# Patient Record
Sex: Male | Born: 1990 | Race: White | Hispanic: No | Marital: Married | Smoking: Current every day smoker
Health system: Southern US, Community
[De-identification: ages and names within clinical notes are randomized; demographics above are authoritative.]

## PROBLEM LIST (undated history)

## (undated) DIAGNOSIS — F419 Anxiety disorder, unspecified: Secondary | ICD-10-CM

## (undated) DIAGNOSIS — F329 Major depressive disorder, single episode, unspecified: Secondary | ICD-10-CM

## (undated) DIAGNOSIS — F32A Depression, unspecified: Secondary | ICD-10-CM

## (undated) DIAGNOSIS — F319 Bipolar disorder, unspecified: Secondary | ICD-10-CM

## (undated) DIAGNOSIS — F1910 Other psychoactive substance abuse, uncomplicated: Secondary | ICD-10-CM

## (undated) DIAGNOSIS — R569 Unspecified convulsions: Secondary | ICD-10-CM

## (undated) HISTORY — DX: Unspecified convulsions: R56.9

## (undated) HISTORY — DX: Major depressive disorder, single episode, unspecified: F32.9

## (undated) HISTORY — DX: Anxiety disorder, unspecified: F41.9

## (undated) HISTORY — DX: Bipolar disorder, unspecified: F31.9

## (undated) HISTORY — PX: FOOT SURGERY: SHX648

## (undated) HISTORY — DX: Other psychoactive substance abuse, uncomplicated: F19.10

## (undated) HISTORY — DX: Depression, unspecified: F32.A

## (undated) HISTORY — PX: MULTIPLE TOOTH EXTRACTIONS: SHX2053

---
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
## 2018-08-15 DIAGNOSIS — Z139 Encounter for screening, unspecified: Secondary | ICD-10-CM

## 2018-08-15 LAB — GLUCOSE, POCT (MANUAL RESULT ENTRY): POC Glucose: 80 mg/dl (ref 70–99)

## 2018-08-15 NOTE — Congregational Nurse Program (Signed)
  Dept: [phone]   Congregational Nurse Program Note  Date of Encounter: 08/15/2018  Past Medical History: No past medical history on file.  Encounter Details: Pt presents to clinic for screening referral for PCP per requirements of admission process at [facility].  Pt denies any current medical concerns, outside of just needing an annual physical.  Pt does states he is in need of dental care/tx.   Pt states he is currently taking meds for his bi-polar/anxiety/depression.   Currently states he is taking meds on normal schedule  Vitals checked and WNL.  Random Blood glucose was WNL (last ate.drank 1 hr prior to appt)  Vitals was reviewed with RN Nurse Case Manager, Donald Sanders and pt was referred to her for post visit f/u and medical case management.  Pt was also enrolled in [facility] program during visit by C.Broadnax  [facility]t was made for [facility] on 08/19/18 at 1:00pm

## 2018-08-19 ENCOUNTER — Other Ambulatory Visit (HOSPITAL_COMMUNITY)
Admission: RE | Admit: 2018-08-19 | Discharge: 2018-08-19 | Disposition: A | Discharge status: Home / Self Care | Payer: Self-pay | Source: Ambulatory Visit | Attending: Physician Assistant | Admitting: Physician Assistant

## 2018-08-19 ENCOUNTER — Encounter: Payer: Self-pay | Admitting: Physician Assistant

## 2018-08-19 ENCOUNTER — Ambulatory Visit: Payer: Self-pay | Admitting: Physician Assistant

## 2018-08-19 VITALS — BP 116/77 | HR 76 | Temp 98.1°F | Ht 69.5 in | Wt 167.8 lb

## 2018-08-19 DIAGNOSIS — Z8619 Personal history of other infectious and parasitic diseases: Secondary | ICD-10-CM

## 2018-08-19 DIAGNOSIS — F39 Unspecified mood [affective] disorder: Secondary | ICD-10-CM

## 2018-08-19 DIAGNOSIS — F1911 Other psychoactive substance abuse, in remission: Secondary | ICD-10-CM

## 2018-08-19 DIAGNOSIS — Z7689 Persons encountering health services in other specified circumstances: Secondary | ICD-10-CM

## 2018-08-19 DIAGNOSIS — F17200 Nicotine dependence, unspecified, uncomplicated: Secondary | ICD-10-CM

## 2018-08-19 LAB — COMPREHENSIVE METABOLIC PANEL
ALT: 430 U/L — ABNORMAL HIGH (ref 0–44)
AST: 281 U/L — AB (ref 15–41)
Albumin: 4.8 g/dL (ref 3.5–5.0)
Alkaline Phosphatase: 119 U/L (ref 38–126)
Anion gap: 9 (ref 5–15)
BUN: 10 mg/dL (ref 6–20)
CO2: 25 mmol/L (ref 22–32)
Calcium: 9.9 mg/dL (ref 8.9–10.3)
Chloride: 102 mmol/L (ref 98–111)
Creatinine, Ser: 0.81 mg/dL (ref 0.61–1.24)
GFR calc Af Amer: >60 mL/min (ref >60)
GFR calc non Af Amer: >60 mL/min (ref >60)
Glucose, Bld: 85 mg/dL (ref 70–99)
POTASSIUM: 4 mmol/L (ref 3.5–5.1)
Sodium: 136 mmol/L (ref 135–145)
Total Bilirubin: 1.1 mg/dL (ref 0.3–1.2)
Total Protein: 8.5 g/dL — ABNORMAL HIGH (ref 6.5–8.1)

## 2018-08-19 NOTE — Progress Notes (Signed)
BP 116/77 (BP Location: Right Arm, Patient Position: Sitting, Cuff Size: Normal)   Pulse 76   Temp 98.1 F (36.7 C)   Ht 5' 9.5" (1.765 m)   Wt 167 lb 12 oz (76.1 kg)   SpO2 98%   BMI 24.42 kg/m    Subjective:    Patient ID: Donald Sanders, male    DOB: 1991-02-15, 28 y.o.   MRN: [id number]  HPI: Donald Sanders is a 28 y.o. male presenting on 08/19/2018 for New Patient (Initial Visit) (previous patient at [facility] in [place]. pt was last seen there 2019)   HPI   Pt moved into [facility] house about a week ago.  He has been to [facility] for [facility] issues.    Pt had some seizures in the past- last one about a year ago- was treated at [facility]- felt to be related to drug use.   Pt diagnosed with Hep C in 2015.  He says he has never been treated due to continuing drug abuse.   Relevant past medical, surgical, family and social history reviewed and updated as indicated. Interim medical history since our last visit reviewed. Allergies and medications reviewed and updated.   Current Outpatient Medications:  .  lithium carbonate 300 MG capsule, Take 300 mg by mouth 2 (two) times daily with a meal., Disp: , Rfl:  .  Mirtazapine (REMERON PO), Take 1 tablet by mouth at bedtime., Disp: , Rfl:  .  QUEtiapine (SEROQUEL) 100 MG tablet, Take 100 mg by mouth at bedtime., Disp: , Rfl:  .  traZODone (DESYREL) 50 MG tablet, Take 50 mg by mouth at bedtime., Disp: , Rfl:   Review of Systems  Constitutional: Negative for appetite change, chills, diaphoresis, fatigue, fever and unexpected weight change.  HENT: Positive for congestion. Negative for dental problem, drooling, ear pain, facial swelling, hearing loss, mouth sores, sneezing, sore throat, trouble swallowing and voice change.   Eyes: Negative for pain, discharge, redness, itching and visual disturbance.  Respiratory: Negative for cough, choking, shortness of breath and wheezing.   Cardiovascular: Negative for chest pain, palpitations  and leg swelling.  Gastrointestinal: Negative for abdominal pain, blood in stool, constipation, diarrhea and vomiting.  Endocrine: Negative for cold intolerance, heat intolerance and polydipsia.  Genitourinary: Positive for decreased urine volume. Negative for dysuria and hematuria.  Musculoskeletal: Positive for arthralgias and back pain. Negative for gait problem.  Skin: Negative for rash.  Allergic/Immunologic: Negative for environmental allergies.  Neurological: Negative for seizures, syncope, light-headedness and headaches.  Hematological: Negative for adenopathy.  Psychiatric/Behavioral: Negative for agitation, dysphoric mood and suicidal ideas. The patient is not nervous/anxious.     Per HPI unless specifically indicated above     Objective:    BP 116/77 (BP Location: Right Arm, Patient Position: Sitting, Cuff Size: Normal)   Pulse 76   Temp 98.1 F (36.7 C)   Ht 5' 9.5" (1.765 m)   Wt 167 lb 12 oz (76.1 kg)   SpO2 98%   BMI 24.42 kg/m   Wt Readings from Last 3 Encounters:  08/19/18 167 lb 12 oz (76.1 kg)  08/15/18 167 lb (75.8 kg)    Physical Exam Vitals signs reviewed.  Constitutional:      Appearance: He is well-developed.  HENT:     Head: Normocephalic and atraumatic.     Mouth/Throat:     Pharynx: No oropharyngeal exudate.  Eyes:     Conjunctiva/sclera: Conjunctivae normal.     Pupils: Pupils are equal, round,  and reactive to light.  Neck:     Musculoskeletal: Neck supple.     Thyroid: No thyromegaly.  Cardiovascular:     Rate and Rhythm: Normal rate and regular rhythm.  Pulmonary:     Effort: Pulmonary effort is normal.     Breath sounds: Normal breath sounds. No wheezing or rales.  Abdominal:     General: Bowel sounds are normal.     Palpations: Abdomen is soft. There is no mass.     Tenderness: There is no abdominal tenderness.  Lymphadenopathy:     Cervical: No cervical adenopathy.  Skin:    General: Skin is warm and dry.     Findings: No rash.   Neurological:     Mental Status: He is alert and oriented to person, place, and time.  Psychiatric:        Behavior: Behavior normal.        Thought Content: Thought content normal.         Assessment & Plan:   Encounter Diagnoses  Name Primary?  . Encounter to establish care Yes  . History of hepatitis C   . Substance abuse in remission (HCC)   . Tobacco use disorder   . Mood disorder (HCC)     -pt at [facility] house for addiction -pt to continue with [facility] for mental health issues -will check hepatitis labs.  If positive, will refer to GI for evaluation and treatment -pt to follow up 1 month.  RTO sooner prn

## 2018-08-20 LAB — HEPATITIS C ANTIBODY: HCV Ab: >11 s/co ratio — ABNORMAL HIGH (ref 0.0–0.9)

## 2018-09-17 ENCOUNTER — Ambulatory Visit: Payer: Self-pay | Admitting: Physician Assistant

## 2018-09-17 DIAGNOSIS — R945 Abnormal results of liver function studies: Secondary | ICD-10-CM

## 2018-09-17 DIAGNOSIS — F17200 Nicotine dependence, unspecified, uncomplicated: Secondary | ICD-10-CM

## 2018-09-17 DIAGNOSIS — F1911 Other psychoactive substance abuse, in remission: Secondary | ICD-10-CM

## 2018-09-17 DIAGNOSIS — R7989 Other specified abnormal findings of blood chemistry: Secondary | ICD-10-CM

## 2018-09-17 DIAGNOSIS — B1920 Unspecified viral hepatitis C without hepatic coma: Secondary | ICD-10-CM | POA: Insufficient documentation

## 2018-09-17 NOTE — Progress Notes (Signed)
There were no vitals taken for this visit.   Subjective:    Patient ID: Donald Sanders, male    DOB: 04/06/1991, 28 y.o.   MRN: [id number]  HPI: Donald Sanders is a 28 y.o. male presenting on 09/17/2018 for No chief complaint on file.   HPI   This is a telemedicine appointment through [employer] due to the coronavirus pandemic  Pt is still living at [facility] house getting treatment for addiction.  He says he is doing well and has no fevers, SOB or other complaints.   He is still smoking.   He is still going to [facility] for MH issues  Relevant past medical, surgical, family and social history reviewed and updated as indicated. Interim medical history since our last visit reviewed. Allergies and medications reviewed and updated.   Current Outpatient Medications:  .  lithium carbonate 300 MG capsule, Take 300 mg by mouth 2 (two) times daily with a meal., Disp: , Rfl:  .  Mirtazapine (REMERON PO), Take 1 tablet by mouth at bedtime., Disp: , Rfl:  .  QUEtiapine (SEROQUEL) 100 MG tablet, Take 100 mg by mouth at bedtime., Disp: , Rfl:  .  traZODone (DESYREL) 50 MG tablet, Take 50 mg by mouth at bedtime., Disp: , Rfl:     Review of Systems  Per HPI unless specifically indicated above     Objective:    There were no vitals taken for this visit.  Wt Readings from Last 3 Encounters:  08/19/18 167 lb 12 oz (76.1 kg)  08/15/18 167 lb (75.8 kg)    Physical Exam Constitutional:      Appearance: Normal appearance.  HENT:     Head: Normocephalic and atraumatic.  Pulmonary:     Effort: Pulmonary effort is normal. No respiratory distress.  Neurological:     Mental Status: He is alert and oriented to person, place, and time.  Psychiatric:        Mood and Affect: Mood normal.        Behavior: Behavior normal.     Results for orders placed or performed during the hospital encounter of 08/19/18  Hepatitis C Antibody  Result Value Ref Range   HCV Ab >11.0 (H) 0.0 - 0.9 s/co ratio   Comprehensive metabolic panel  Result Value Ref Range   Sodium 136 135 - 145 mmol/L   Potassium 4.0 3.5 - 5.1 mmol/L   Chloride 102 98 - 111 mmol/L   CO2 25 22 - 32 mmol/L   Glucose, Bld 85 70 - 99 mg/dL   BUN 10 6 - 20 mg/dL   Creatinine, Ser 3.81 0.61 - 1.24 mg/dL   Calcium 9.9 8.9 - 77.1 mg/dL   Total Protein 8.5 (H) 6.5 - 8.1 g/dL   Albumin 4.8 3.5 - 5.0 g/dL   AST 165 (H) 15 - 41 U/L   ALT 430 (H) 0 - 44 U/L   Alkaline Phosphatase 119 38 - 126 U/L   Total Bilirubin 1.1 0.3 - 1.2 mg/dL   GFR calc non Af Amer >60 >60 mL/min   GFR calc Af Amer >60 >60 mL/min   Anion gap 9 5 - 15      Assessment & Plan:   Encounter Diagnoses  Name Primary?  . Hepatitis C virus infection without hepatic coma, unspecified chronicity Yes  . Substance abuse in remission (HCC)   . Tobacco use disorder   . Elevated LFTs      -reviewed labs with pt -will Refer to GI  for hep C -mailed pt an application for [facility] charity care -Pt was counseled to avoid APAP due to elevated LFTs -Pt encouraged to wash hands, try to keep social distancing, etc.  Also encouraged pt to stop smoking -pt to Follow up in February.  RTO sooner prn

## 2018-09-18 ENCOUNTER — Encounter: Payer: Self-pay | Admitting: Physician Assistant

## 2018-09-23 ENCOUNTER — Encounter: Payer: Self-pay | Admitting: Gastroenterology

## 2018-12-31 ENCOUNTER — Ambulatory Visit (INDEPENDENT_AMBULATORY_CARE_PROVIDER_SITE_OTHER): Payer: Self-pay | Admitting: Gastroenterology

## 2018-12-31 ENCOUNTER — Other Ambulatory Visit: Payer: Self-pay

## 2018-12-31 ENCOUNTER — Encounter: Payer: Self-pay | Admitting: Gastroenterology

## 2018-12-31 VITALS — BP 116/71 | HR 78 | Temp 98.5°F | Ht 69.0 in | Wt 176.2 lb

## 2018-12-31 DIAGNOSIS — R7989 Other specified abnormal findings of blood chemistry: Secondary | ICD-10-CM

## 2018-12-31 DIAGNOSIS — B182 Chronic viral hepatitis C: Secondary | ICD-10-CM

## 2018-12-31 DIAGNOSIS — R945 Abnormal results of liver function studies: Secondary | ICD-10-CM

## 2018-12-31 NOTE — Assessment & Plan Note (Signed)
28 year old gentleman with history of polysubstance abuse, currently in remission, presenting for positive hepatitis C antibody, transaminitis.  Patient initially told he had hepatitis C after plasma donation in 2015.  He reports that subsequent testing was negative for hepatitis C but it is not clear if he had a negative vomiting.  Recent hepatitis C antibody positive.  Transaminases more elevated than what would be expected with chronic hep C only.  Less likely, but cannot exclude, acute reinfection of HCV.  At this point in time, obtain HCVRNA with genotype.  Update LFTs.  Screen for hemochromatosis, HIV, hepatitis B.  Evaluate for fibrosis.  Discussed hepatitis C treatment at length with patient.  Discussed natural history, modes of transmission for hepatitis C as well.  Precautions provided.  If labs are favorable, we will move towards hepatitis C treatment in the near future.  Patient is aware that compliance is crucial for a eradication of hepatitis C.  He is aware that he can be reinfected even after initial cure. Further recommendations in near future once labs and u/s results available.

## 2018-12-31 NOTE — Patient Instructions (Signed)
1. Please have your labs and ultrasound done. 2. Please do not share razors, nail clippers, toothbrushes, needles. Use barrier protection (condoms) during sexual activity.    Hepatitis C  Hepatitis C is a viral infection of the liver. It can lead to scarring of the liver (cirrhosis), liver failure, or liver cancer. Hepatitis C may go undetected for months or years because people with the infection may not have symptoms, or they may have only mild symptoms. What are the causes? This condition is caused by the hepatitis C virus (HCV). The virus can spread from person to person (is contagious) through:  Blood.  Childbirth. A woman who has hepatitis C can pass it to her baby during birth.  Bodily fluids, such as breast milk, tears, semen, vaginal fluids, and saliva.  Blood transfusions or organ transplants done in the [place] before 1992. What increases the risk? The following factors may make you more likely to develop this condition:  Having contact with unclean (contaminated) needles or syringes. This may result from: ? Acupuncture. ? Tattoing. ? Body piercing. ? Injecting drugs.  Having unprotected sex with someone who is infected.  Needing treatment to filter your blood (kidney dialysis).  Having HIV (human immunodeficiency virus) or AIDS (acquired immunodeficiency syndrome).  Working in a job that involves contact with blood or bodily fluids, such as health care. What are the signs or symptoms? Symptoms of this condition include:  Fatigue.  Loss of appetite.  Nausea.  Vomiting.  Abdominal pain.  Dark yellow urine.  Yellowish skin and eyes (jaundice).  Itchy skin.  Clay-colored bowel movements.  Joint pain.  Bleeding and bruising easily.  Fluid building up in your stomach (ascites). In some cases, you may not have any symptoms. How is this diagnosed? This condition is diagnosed with:  Blood tests.  Other tests to check how well your liver is  functioning. They may include: ? Magnetic resonance elastography (MRE). This imaging test uses MRIs and sound waves to measure liver stiffness. ? Transient elastography. This imaging test uses ultrasounds to measure liver stiffness. ? Liver biopsy. This test requires taking a small tissue sample from your liver to examine it under a microscope. How is this treated? Your health care provider may perform noninvasive tests or a liver biopsy to help decide the best course of treatment. Treatment may include:  Antiviral medicines and other medicines.  Follow-up treatments every 6-12 months for infections or other liver conditions.  Receiving a donated liver (liver transplant). Follow these instructions at home: Medicines  Take over-the-counter and prescription medicines only as told by your health care provider.  Take your antiviral medicine as told by your health care provider. Do not stop taking the antiviral even if you start to feel better.  Do not take any medicines unless approved by your health care provider, including over-the-counter medicines and birth control pills. Activity  Rest as needed.  Do not have sex unless approved by your health care provider.  Ask your health care provider when you may return to school or work. Eating and drinking   Eat a balanced diet with plenty of fruits and vegetables, whole grains, and lowfat (lean) meats or non-meat proteins (such as beans or tofu).  Drink enough fluids to keep your urine clear or pale yellow.  Do not drink alcohol. General instructions  Do not share toothbrushes, nail clippers, or razors.  Wash your hands frequently with soap and water. If soap and water are not available, use hand sanitizer.  Cover any cuts or open sores on your skin to prevent spreading the virus.  Keep all follow-up visits as told by your health care provider. This is important. You may need follow-up visits every 6-12 months. How is this  prevented? There is no vaccine for hepatitis C. The only way to prevent the disease is to reduce the risk of exposure to the virus. Make sure you:  Wash your hands frequently with soap and water. If soap and water are not available, use hand sanitizer.  Do not share needles or syringes.  Practice safe sex and use condoms.  Avoid handling blood or bodily fluids without gloves or other protection.  Avoid getting tattoos or piercings in shops or other locations that are not clean. Contact a health care provider if:  You have a fever.  You develop abdominal pain.  You pass dark urine.  You pass clay-colored stools.  You develop joint pain. Get help right away if:  You have increasing fatigue or weakness.  You lose your appetite.  You cannot eat or drink without vomiting.  You develop jaundice or your jaundice gets worse.  You bruise or bleed easily. Summary  Hepatitis C is a viral infection of the liver. It can lead to scarring of the liver (cirrhosis), liver failure, or liver cancer.  The hepatitis C virus (HCV) causes this condition. The virus can pass from person to person (is contagious).  You should not take any medicines unless approved by your health care provider. This includes over-the-counter medicines and birth control pills. This information is not intended to replace advice given to you by your health care provider. Make sure you discuss any questions you have with your health care provider. Document Released: 06/09/2000 Document Revised: 05/25/2017 Document Reviewed: 07/18/2016 Elsevier Patient Education  2020 [employer].

## 2018-12-31 NOTE — Progress Notes (Signed)
Primary Care Physician:  Soyla Dryer, PA-C  Primary Gastroenterologist:  Barney Drain, MD   Chief Complaint  Patient presents with  . Hepatitis C    has not had treatment. Has not had any drug use since 06/2018 per pt    HPI:  Donald Sanders is a 28 y.o. male here at the request of Soyla Dryer, PA-C for further evaluation of chronic hepatitis C.  She tells me that he initially found out he had hepatitis C after donating plasma in 2015.  States that his PCP ran additional test and told him that he was "borderline" because the second test was negative.  It is not clear if the second test was an RNA level.  Over the past 1 year, he has had labs done routinely through the "detox" clinic and was noted to have elevated LFTs which were presumably related to his chronic hepatitis C.  Patient has been clean for 6 months.  He quit alcohol and IV drug use back in January.  Currently living at [facility].  He plans to stay locally when he is released in the next couple months.  Labs back in February showed a positive hepatitis C antibody, LFT showed normal total bilirubin, alk phos.  His albumin was normal.  His AST was 281, ALT 130.  Clinically he feels well.  Denies any history of jaundice.  No abdominal pain.  Appetite is good.  Works in Biomedical scientist.  No constipation, diarrhea, melena, rectal bleeding, itching, weight loss.  No heartburn.  Currently sexually active, partner has hepatitis C.  Reports using barrier protection.  No family history of liver disease.  Previous alcohol abuse, no history of withdrawals.  Last known well 6 months ago.   Current Outpatient Medications  Medication Sig Dispense Refill  . ibuprofen (ADVIL) 200 MG tablet Take 200 mg by mouth every 6 (six) hours as needed.    . Mirtazapine (REMERON PO) Take 1 tablet by mouth at bedtime.     No current facility-administered medications for this visit.     Allergies as of 12/31/2018 - Review Complete 12/31/2018  Allergen  Reaction Noted  . Tuberculin tests Itching 08/19/2018    Past Medical History:  Diagnosis Date  . Anxiety   . Bipolar 1 disorder ([place])   . Depression   . Seizures ([place])    last seizure around 2019  . Substance abuse [facility])     Past Surgical History:  Procedure Laterality Date  . FOOT SURGERY Right    "screw in foot"  . MULTIPLE TOOTH EXTRACTIONS    . NOSE SURGERY  2006    Family History  Problem Relation Age of Onset  . Hypertension Maternal Grandmother   . Cancer Maternal Grandfather        lung, skin and brain cancer  . Liver disease Neg Hx   . Colon cancer Neg Hx     Social History   Socioeconomic History  . Marital status: Married    Spouse name: Not on file  . Number of children: Not on file  . Years of education: Not on file  . Highest education level: Not on file  Occupational History  . Not on file  Social Needs  . Financial resource strain: Not on file  . Food insecurity    Worry: Not on file    Inability: Not on file  . Transportation needs    Medical: Not on file    Non-medical: Not on file  Tobacco Use  . Smoking  status: Current Every Day Smoker    Packs/day: 0.50    Years: 15.00    Pack years: 7.50    Types: Cigarettes, E-cigarettes  . Smokeless tobacco: Never Used  Substance and Sexual Activity  . Alcohol use: Not Currently    Comment: none since 06/2018. 12 pack per week  . Drug use: Not Currently    Types: Heroin, Methamphetamines, Marijuana, Cocaine    Comment: none since 07/25/18.  main drug heroin, meth  . Sexual activity: Not on file  Lifestyle  . Physical activity    Days per week: Not on file    Minutes per session: Not on file  . Stress: Not on file  Relationships  . Social Herbalist on phone: Not on file    Gets together: Not on file    Attends religious service: Not on file    Active member of club or organization: Not on file    Attends meetings of clubs or organizations: Not on file    Relationship status:  Not on file  . Intimate partner violence    Fear of current or ex partner: Not on file    Emotionally abused: Not on file    Physically abused: Not on file    Forced sexual activity: Not on file  Other Topics Concern  . Not on file  Social History Narrative  . Not on file      ROS:  General: Negative for anorexia, weight loss, fever, chills, fatigue, weakness. Eyes: Negative for vision changes.  ENT: Negative for hoarseness, difficulty swallowing , nasal congestion. CV: Negative for chest pain, angina, palpitations, dyspnea on exertion, peripheral edema.  Respiratory: Negative for dyspnea at rest, dyspnea on exertion, cough, sputum, wheezing.  GI: See history of present illness. GU:  Negative for dysuria, hematuria, urinary incontinence, urinary frequency, nocturnal urination.  MS: Negative for joint pain, low back pain.  Derm: Negative for rash or itching.  Neuro: Negative for weakness, abnormal sensation, seizure, frequent headaches, memory loss, confusion.  Psych: Negative for anxiety, depression, suicidal ideation, hallucinations.  Endo: Negative for unusual weight change.  Heme: Negative for bruising or bleeding. Allergy: Negative for rash or hives.    Physical Examination:  BP 116/71   Pulse 78   Temp 98.5 F (36.9 C) (Oral)   Ht _0  (1.753 m)   Wt 176 lb 3.2 oz (79.9 kg)   BMI 26.02 kg/m    General: Well-nourished, well-developed in no acute distress.  Head: Normocephalic, atraumatic.   Eyes: Conjunctiva pink, no icterus. Mouth: Oropharyngeal mucosa moist and pink , no lesions erythema or exudate. Neck: Supple without thyromegaly, masses, or lymphadenopathy.  Lungs: Clear to auscultation bilaterally.  Heart: Regular rate and rhythm, no murmurs rubs or gallops.  Abdomen: Bowel sounds are normal, nontender, nondistended, no hepatosplenomegaly or masses, no abdominal bruits or    hernia , no rebound or guarding.   Rectal: not performed Extremities: No lower  extremity edema. No clubbing or deformities.  Neuro: Alert and oriented x 4 , grossly normal neurologically.  Skin: Warm and dry, no rash or jaundice.   Psych: Alert and cooperative, normal mood and affect.  Labs: Lab Results  Component Value Date   CREATININE 0.81 08/19/2018   BUN 10 08/19/2018   NA 136 08/19/2018   K 4.0 08/19/2018   CL 102 08/19/2018   CO2 25 08/19/2018   Lab Results  Component Value Date   ALT 430 (H) 08/19/2018   AST  281 (H) 08/19/2018   ALKPHOS 119 08/19/2018   BILITOT 1.1 08/19/2018   No results found for: HAV, HEPAIGM, HEPBIGM, HEPBCAB, HBEAG, HEPCAB No results found for: HAV, HEPAIGM, HEPBIGM, HEPBCAB, HBEAG, HEPCAB c   Imaging Studies: No results found.

## 2019-01-01 NOTE — Progress Notes (Signed)
cc'ed to pcp °

## 2019-01-03 ENCOUNTER — Other Ambulatory Visit (HOSPITAL_COMMUNITY)
Admission: RE | Admit: 2019-01-03 | Discharge: 2019-01-03 | Disposition: A | Discharge status: Home / Self Care | Payer: Self-pay | Source: Ambulatory Visit | Attending: Physician Assistant | Admitting: Physician Assistant

## 2019-01-03 ENCOUNTER — Ambulatory Visit (HOSPITAL_COMMUNITY)
Admission: RE | Admit: 2019-01-03 | Discharge: 2019-01-03 | Disposition: A | Discharge status: Home / Self Care | Payer: Self-pay | Source: Ambulatory Visit | Attending: Gastroenterology | Admitting: Gastroenterology

## 2019-01-03 ENCOUNTER — Other Ambulatory Visit: Payer: Self-pay

## 2019-01-03 DIAGNOSIS — R945 Abnormal results of liver function studies: Secondary | ICD-10-CM | POA: Insufficient documentation

## 2019-01-03 DIAGNOSIS — B182 Chronic viral hepatitis C: Secondary | ICD-10-CM | POA: Insufficient documentation

## 2019-01-03 DIAGNOSIS — R69 Illness, unspecified: Secondary | ICD-10-CM | POA: Insufficient documentation

## 2019-01-03 DIAGNOSIS — R7989 Other specified abnormal findings of blood chemistry: Secondary | ICD-10-CM

## 2019-01-03 LAB — CBC WITH DIFFERENTIAL/PLATELET
Abs Immature Granulocytes: 0.01 10*3/uL (ref 0.00–0.07)
Basophils Absolute: 0.1 10*3/uL (ref 0.0–0.1)
Basophils Relative: 1 %
Eosinophils Absolute: 0.5 10*3/uL (ref 0.0–0.5)
Eosinophils Relative: 9 %
HCT: 42.2 % (ref 39.0–52.0)
Hemoglobin: 14.1 g/dL (ref 13.0–17.0)
Immature Granulocytes: 0 %
Lymphocytes Relative: 38 %
Lymphs Abs: 1.9 10*3/uL (ref 0.7–4.0)
MCH: 30.3 pg (ref 26.0–34.0)
MCHC: 33.4 g/dL (ref 30.0–36.0)
MCV: 90.8 fL (ref 80.0–100.0)
Monocytes Absolute: 0.6 10*3/uL (ref 0.1–1.0)
Monocytes Relative: 12 %
Neutro Abs: 1.9 10*3/uL (ref 1.7–7.7)
Neutrophils Relative %: 40 %
Platelets: 230 10*3/uL (ref 150–400)
RBC: 4.65 MIL/uL (ref 4.22–5.81)
RDW: 13.4 % (ref 11.5–15.5)
WBC: 5 10*3/uL (ref 4.0–10.5)
nRBC: 0 % (ref 0.0–0.2)

## 2019-01-03 LAB — COMPREHENSIVE METABOLIC PANEL
ALT: 102 U/L — ABNORMAL HIGH (ref 0–44)
AST: 93 U/L — ABNORMAL HIGH (ref 15–41)
Albumin: 4.7 g/dL (ref 3.5–5.0)
Alkaline Phosphatase: 80 U/L (ref 38–126)
Anion gap: 8 (ref 5–15)
BUN: 15 mg/dL (ref 6–20)
CO2: 23 mmol/L (ref 22–32)
Calcium: 9.4 mg/dL (ref 8.9–10.3)
Chloride: 109 mmol/L (ref 98–111)
Creatinine, Ser: 0.83 mg/dL (ref 0.61–1.24)
GFR calc Af Amer: >60 mL/min (ref >60)
GFR calc non Af Amer: >60 mL/min (ref >60)
Glucose, Bld: 88 mg/dL (ref 70–99)
Potassium: 3.8 mmol/L (ref 3.5–5.1)
Sodium: 140 mmol/L (ref 135–145)
Total Bilirubin: 1 mg/dL (ref 0.3–1.2)
Total Protein: 7.8 g/dL (ref 6.5–8.1)

## 2019-01-03 LAB — FERRITIN: Ferritin: 84 ng/mL (ref 24–336)

## 2019-01-03 LAB — IRON AND TIBC
Iron: 143 ug/dL (ref 45–182)
Saturation Ratios: 36 % (ref 17.9–39.5)
TIBC: 397 ug/dL (ref 250–450)
UIBC: 254 ug/dL

## 2019-01-04 LAB — HIV ANTIBODY (ROUTINE TESTING W REFLEX): HIV Screen 4th Generation wRfx: NONREACTIVE

## 2019-01-04 LAB — HEPATITIS B CORE ANTIBODY, TOTAL: Hep B Core Total Ab: NEGATIVE

## 2019-01-04 LAB — HEPATITIS B SURFACE ANTIGEN: Hepatitis B Surface Ag: NEGATIVE

## 2019-01-04 LAB — HEPATITIS B SURFACE ANTIBODY, QUANTITATIVE: Hep B S AB Quant (Post): <3.1 m[IU]/mL — ABNORMAL LOW (ref >9.9)

## 2019-01-06 ENCOUNTER — Other Ambulatory Visit: Payer: Self-pay | Admitting: *Deleted

## 2019-01-06 DIAGNOSIS — Z20822 Contact with and (suspected) exposure to covid-19: Secondary | ICD-10-CM

## 2019-01-06 NOTE — Progress Notes (Signed)
Donald Sanders

## 2019-01-07 LAB — HCV FIBROSURE
ALPHA 2-MACROGLOBULINS, QN: 359 mg/dL — ABNORMAL HIGH (ref 110–276)
ALT (SGPT) P5P: 109 IU/L — ABNORMAL HIGH (ref 0–55)
Apolipoprotein A-1: 109 mg/dL (ref 101–178)
Bilirubin, Total: 0.7 mg/dL (ref 0.0–1.2)
Fibrosis Score: 0.57 — ABNORMAL HIGH (ref 0.00–0.21)
GGT: 53 IU/L (ref 0–65)
Haptoglobin: 114 mg/dL (ref 17–317)
Necroinflammat Activity Score: 0.7 — ABNORMAL HIGH (ref 0.00–0.17)

## 2019-01-07 NOTE — Progress Notes (Signed)
Pt is aware.  

## 2019-01-09 LAB — HCV RNA QUANT RFLX ULTRA OR GENOTYP
HCV RNA Qnt(log copy/mL): 5.407 log10 IU/mL
HepC Qn: 255000 IU/mL

## 2019-01-09 LAB — HEPATITIS C GENOTYPE

## 2019-01-10 LAB — NOVEL CORONAVIRUS, NAA: SARS-CoV-2, NAA: NOT DETECTED

## 2019-02-17 ENCOUNTER — Telehealth: Payer: Self-pay | Admitting: Gastroenterology

## 2019-02-17 NOTE — Telephone Encounter (Signed)
[phone]  Patient received letter, please call.  He is wanting to know when he can start his hep c treatment

## 2019-02-19 NOTE — Telephone Encounter (Signed)
I have called [facility] and spoke to Lemar Livings and she is faxing the form for pt and provider to complete and fax back.

## 2019-02-19 NOTE — Telephone Encounter (Signed)
Tried to call pt and could not leave a message.  

## 2019-02-19 NOTE — Telephone Encounter (Signed)
Pt is aware the form is here and ready to be completed. He requested me mail it to his address and he will complete and get back to me. He is out of town at this time, but said to go on and mail it. I am putting in mail now.

## 2019-05-13 ENCOUNTER — Other Ambulatory Visit: Payer: Self-pay

## 2019-05-13 ENCOUNTER — Emergency Department (HOSPITAL_COMMUNITY)
Admission: EM | Admit: 2019-05-13 | Discharge: 2019-05-13 | Disposition: A | Discharge status: Home / Self Care | Payer: Self-pay

## 2019-05-13 NOTE — ED Triage Notes (Signed)
Called for triage x1.

## 2019-06-17 ENCOUNTER — Other Ambulatory Visit: Payer: Self-pay

## 2019-07-09 ENCOUNTER — Emergency Department (HOSPITAL_COMMUNITY)
Admission: EM | Admit: 2019-07-09 | Discharge: 2019-07-09 | Disposition: A | Discharge status: Home / Self Care | Payer: Self-pay | Attending: Emergency Medicine | Admitting: Emergency Medicine

## 2019-07-09 ENCOUNTER — Emergency Department (HOSPITAL_COMMUNITY): Payer: Self-pay

## 2019-07-09 ENCOUNTER — Other Ambulatory Visit: Payer: Self-pay

## 2019-07-09 DIAGNOSIS — R0789 Other chest pain: Secondary | ICD-10-CM | POA: Insufficient documentation

## 2019-07-09 DIAGNOSIS — F17210 Nicotine dependence, cigarettes, uncomplicated: Secondary | ICD-10-CM | POA: Insufficient documentation

## 2019-07-09 DIAGNOSIS — R079 Chest pain, unspecified: Secondary | ICD-10-CM

## 2019-07-09 DIAGNOSIS — Z79899 Other long term (current) drug therapy: Secondary | ICD-10-CM | POA: Insufficient documentation

## 2019-07-09 LAB — BASIC METABOLIC PANEL
Anion gap: 10 (ref 5–15)
BUN: 9 mg/dL (ref 6–20)
CO2: 22 mmol/L (ref 22–32)
Calcium: 9.2 mg/dL (ref 8.9–10.3)
Chloride: 102 mmol/L (ref 98–111)
Creatinine, Ser: 0.78 mg/dL (ref 0.61–1.24)
GFR calc Af Amer: >60 mL/min (ref >60)
GFR calc non Af Amer: >60 mL/min (ref >60)
Glucose, Bld: 123 mg/dL — ABNORMAL HIGH (ref 70–99)
Potassium: 3.9 mmol/L (ref 3.5–5.1)
Sodium: 134 mmol/L — ABNORMAL LOW (ref 135–145)

## 2019-07-09 LAB — TROPONIN I (HIGH SENSITIVITY): Troponin I (High Sensitivity): <2 ng/L (ref <18)

## 2019-07-09 LAB — CBC
HCT: 43.1 % (ref 39.0–52.0)
Hemoglobin: 14 g/dL (ref 13.0–17.0)
MCH: 30.6 pg (ref 26.0–34.0)
MCHC: 32.5 g/dL (ref 30.0–36.0)
MCV: 94.1 fL (ref 80.0–100.0)
Platelets: 245 10*3/uL (ref 150–400)
RBC: 4.58 MIL/uL (ref 4.22–5.81)
RDW: 13.2 % (ref 11.5–15.5)
WBC: 7.2 10*3/uL (ref 4.0–10.5)
nRBC: 0 % (ref 0.0–0.2)

## 2019-07-09 MED ORDER — PREDNISONE 50 MG PO TABS
60.0000 mg | ORAL_TABLET | Freq: Once | ORAL | Status: AC
  Administered 2019-07-09: 60 mg via ORAL
  Filled 2019-07-09: qty 1

## 2019-07-09 MED ORDER — PREDNISONE 10 MG PO TABS: ORAL_TABLET | ORAL | 0 refills | Status: DC

## 2019-07-09 NOTE — ED Triage Notes (Signed)
Patient states chest pain x 2 days. Chest pain is worse upon inhalation and movement. 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 Patient took ibuprofen at home with little to no relief.

## 2019-07-09 NOTE — Discharge Instructions (Addendum)
Your lab tests, ekg and chest xray is normal tonight. Your exam suggests your symptoms are from a chest wall (muscle or rib cage) pain or inflammation.  Use the prednisone as prescribed to help heal this area.  You may also get some relief by using a heating pad to the site for 20 minutes several times daily.

## 2019-07-09 NOTE — ED Provider Notes (Signed)
[facility] EMERGENCY DEPARTMENT Provider Note   CSN: [id number] Arrival date & time: 07/09/19  1946     History Chief Complaint  Patient presents with  . Chest Pain    Donald Sanders is a 29 y.o. male with a history of anxiety, bipolar disorder, hepatitis C and substance abuse presenting with a 2-day history of left sided chest pain.  He describes sharp pain with deep inspiration, movement and palpation.  He works in Network engineer, reporting he knows he breaths in the material, despite wearing a mask while working and knows this isn't good for him.  He denies fevers or chills, denies cough, sob, denies trauma to the site.  His is pain is better when supine. He has had no extremity injury or swelling, no long periods of inactivity. He is a smoker, denies cocaine use. He took ibuprofen prior to arrival with no improvement in pain.     The history is provided by the patient.       Past Medical History:  Diagnosis Date  . Anxiety   . Bipolar 1 disorder (HCC)   . Depression   . Seizures (HCC)    last seizure around 2019  . Substance abuse [facility])     Patient Active Problem List   Diagnosis Date Noted  . Abnormal LFTs 12/31/2018  . Hepatitis C virus infection without hepatic coma 09/17/2018    Past Surgical History:  Procedure Laterality Date  . FOOT SURGERY Right    "screw in foot"  . MULTIPLE TOOTH EXTRACTIONS    . NOSE SURGERY  2006       Family History  Problem Relation Age of Onset  . Hypertension Maternal Grandmother   . Cancer Maternal Grandfather        lung, skin and brain cancer  . Liver disease Neg Hx   . Colon cancer Neg Hx     Social History   Tobacco Use  . Smoking status: Current Every Day Smoker    Packs/day: 0.50    Years: 15.00    Pack years: 7.50    Types: Cigarettes, E-cigarettes  . Smokeless tobacco: Never Used  Substance Use Topics  . Alcohol use: Not Currently    Comment: none since 06/2018. 12 pack per week  . Drug use: Not Currently    Types: Heroin, Methamphetamines, Marijuana, Cocaine    Comment: none since 07/25/18.  main drug heroin, meth    Home Medications Prior to Admission medications   Medication Sig Start Date End Date Taking? Authorizing Provider  ibuprofen (ADVIL) 200 MG tablet Take 200 mg by mouth every 6 (six) hours as needed.    [provider]  Mirtazapine (REMERON PO) Take 1 tablet by mouth at bedtime.    [provider]  predniSONE (DELTASONE) 10 MG tablet Take 6 tablets day one, 5 tablets day two, 4 tablets day three, 3 tablets day four, 2 tablets day five, then 1 tablet day six 07/09/19   Burgess Amor, PA-C    Allergies    Tuberculin tests  Review of Systems   Review of Systems  Constitutional: Negative for chills and fever.  HENT: Negative for congestion and sore throat.   Eyes: Negative.   Respiratory: Negative for chest tightness and shortness of breath.   Cardiovascular: Positive for chest pain. Negative for palpitations and leg swelling.  Gastrointestinal: Negative for abdominal pain, nausea and vomiting.  Genitourinary: Negative.   Musculoskeletal: Negative for arthralgias, joint swelling and neck pain.  Skin: Negative.  Negative  for rash and wound.  Neurological: Negative for dizziness, weakness, light-headedness, numbness and headaches.  Psychiatric/Behavioral: Negative.     Physical Exam Updated Vital Signs BP 113/71 (BP Location: Right Arm)   Pulse 71   Temp 98.1 F (36.7 C) (Oral)   Resp 15   Ht 5\' 10"  (1.778 m)   Wt 72.6 kg   SpO2 98%   BMI 22.96 kg/m   Physical Exam Vitals and nursing note reviewed.  Constitutional:      Appearance: He is well-developed.  HENT:     Head: Normocephalic and atraumatic.  Eyes:     Conjunctiva/sclera: Conjunctivae normal.  Cardiovascular:     Rate and Rhythm: Normal rate and regular rhythm.     Heart sounds: Normal heart sounds.  Pulmonary:     Effort: Pulmonary effort is normal. No tachypnea, accessory muscle usage  or respiratory distress.     Breath sounds: Normal breath sounds. No decreased breath sounds, wheezing or rhonchi.  Chest:     Chest wall: Tenderness present.     Comments: Reproducible pain left upper chest wall and upper back with palpation and with abduction of the left shoulder. No crepitus. No deformity or visible trauma, no edema.  Abdominal:     General: Bowel sounds are normal.     Palpations: Abdomen is soft.     Tenderness: There is no abdominal tenderness.  Musculoskeletal:        General: Normal range of motion.     Cervical back: Normal range of motion.     Right lower leg: No tenderness. No edema.     Left lower leg: No tenderness. No edema.  Skin:    General: Skin is warm and dry.  Neurological:     Mental Status: He is alert.     ED Results / Procedures / Treatments   Labs (all labs ordered are listed, but only abnormal results are displayed) Labs Reviewed  BASIC METABOLIC PANEL - Abnormal; Notable for the following components:      Result Value   Sodium 134 (*)    Glucose, Bld 123 (*)    All other components within normal limits  CBC  TROPONIN I (HIGH SENSITIVITY)  TROPONIN I (HIGH SENSITIVITY)    EKG EKG Interpretation  Date/Time:  Wednesday July 09 2019 20:21:44 EST Ventricular Rate:  90 PR Interval:  144 QRS Duration: 90 QT Interval:  332 QTC Calculation: 406 R Axis:   87 Text Interpretation: Normal sinus rhythm Right atrial enlargement Borderline ECG No old tracing to compare Confirmed by [birth year]1990 (Eber Hong) on 07/09/2019 8:25:11 PM   Radiology DG Chest Port 1 View  Result Date: 07/09/2019 CLINICAL DATA:  29 year old male with 2 days of chest pain, pleuritic pain. Smoker. EXAM: PORTABLE CHEST 1 VIEW COMPARISON:  None. FINDINGS: Portable AP upright view at 2159 hours. Lung volumes and mediastinal contours within normal limits. Visualized tracheal air column is within normal limits. Borderline to mild diffuse increased pulmonary interstitial  markings. Otherwise when allowing for portable technique both lungs appear clear. No pneumothorax or pleural effusion. Negative visible bowel gas pattern and osseous structures. IMPRESSION: Negative portable chest; borderline to mildly increased pulmonary interstitial markings are probably smoking related. Electronically Signed   By: 2160 M.D.   On: 07/09/2019 22:09    Procedures Procedures (including critical care time)  Medications Ordered in ED Medications  predniSONE (DELTASONE) tablet 60 mg (has no administration in time range)    ED Course  I have reviewed  the triage vital signs and the nursing notes.  Pertinent labs & imaging results that were available during my care of the patient were reviewed by me and considered in my medical decision making (see chart for details).    MDM Rules/Calculators/A&P                     Labs, imaging and ekg reviewed and interpreted with no significant findings.   Pt with left sided chest wall pain which is reproducible favoring chest wall pain/muscle strain, pt is perc negative, PE unlikely. No risk factos for ACS with a negative troponin.   CXR clear with no infection.   Final Clinical Impression(s) / ED Diagnoses Final diagnoses:  Chest wall pain    Rx / DC Orders ED Discharge Orders         Ordered    predniSONE (DELTASONE) 10 MG tablet     07/09/19 2306           Evalee Jefferson, PA-C 07/09/19 2310    Noemi Chapel, MD 07/10/19 [phone]

## 2019-07-10 LAB — TROPONIN I (HIGH SENSITIVITY): Troponin I (High Sensitivity): <2 ng/L (ref <18)

## 2019-07-28 ENCOUNTER — Ambulatory Visit: Payer: Self-pay | Admitting: Physician Assistant

## 2019-08-21 ENCOUNTER — Ambulatory Visit: Payer: Self-pay | Attending: Internal Medicine

## 2019-08-21 ENCOUNTER — Other Ambulatory Visit: Payer: Self-pay

## 2019-08-21 DIAGNOSIS — Z20822 Contact with and (suspected) exposure to covid-19: Secondary | ICD-10-CM | POA: Insufficient documentation

## 2019-08-22 LAB — NOVEL CORONAVIRUS, NAA: SARS-CoV-2, NAA: NOT DETECTED

## 2019-12-18 ENCOUNTER — Encounter: Payer: Self-pay | Admitting: Physician Assistant

## 2019-12-18 ENCOUNTER — Other Ambulatory Visit: Payer: Self-pay

## 2019-12-18 ENCOUNTER — Ambulatory Visit: Payer: Self-pay | Admitting: Physician Assistant

## 2019-12-18 VITALS — BP 106/78 | HR 68 | Temp 98.2°F | Ht 69.75 in | Wt 145.5 lb

## 2019-12-18 DIAGNOSIS — F1911 Other psychoactive substance abuse, in remission: Secondary | ICD-10-CM

## 2019-12-18 DIAGNOSIS — Z2821 Immunization not carried out because of patient refusal: Secondary | ICD-10-CM

## 2019-12-18 DIAGNOSIS — K029 Dental caries, unspecified: Secondary | ICD-10-CM

## 2019-12-18 DIAGNOSIS — Z7689 Persons encountering health services in other specified circumstances: Secondary | ICD-10-CM

## 2019-12-18 DIAGNOSIS — B1920 Unspecified viral hepatitis C without hepatic coma: Secondary | ICD-10-CM

## 2019-12-18 DIAGNOSIS — Z13220 Encounter for screening for lipoid disorders: Secondary | ICD-10-CM

## 2019-12-18 DIAGNOSIS — Z131 Encounter for screening for diabetes mellitus: Secondary | ICD-10-CM

## 2019-12-18 DIAGNOSIS — R7309 Other abnormal glucose: Secondary | ICD-10-CM

## 2019-12-18 DIAGNOSIS — F17200 Nicotine dependence, unspecified, uncomplicated: Secondary | ICD-10-CM

## 2019-12-18 DIAGNOSIS — F489 Nonpsychotic mental disorder, unspecified: Secondary | ICD-10-CM

## 2019-12-18 DIAGNOSIS — K0889 Other specified disorders of teeth and supporting structures: Secondary | ICD-10-CM

## 2019-12-18 DIAGNOSIS — R7989 Other specified abnormal findings of blood chemistry: Secondary | ICD-10-CM

## 2019-12-18 MED ORDER — AMOXICILLIN 500 MG PO CAPS: 500.0000 mg | ORAL_CAPSULE | Freq: Three times a day (TID) | ORAL | 0 refills | Status: DC

## 2019-12-18 NOTE — Progress Notes (Signed)
BP 106/78   Pulse 68   Temp 98.2 F (36.8 C)   Ht 5' 9.75" (1.772 m)   Wt 145 lb 8 oz (66 kg)   SpO2 97%   BMI 21.03 kg/m    Subjective:    Patient ID: Donald Sanders, male    DOB: Sep 28, 1990, 29 y.o.   MRN: [id number]  HPI: Donald Sanders is a 29 y.o. male presenting on 12/18/2019 for New Patient (Initial Visit) and Dental Problem (pt denies drainage or swelling only pain)   HPI    Pt had a negative covid 19 screening questionnaire.     Pt is a 40yoM with history of heroin and meth adiction, bipolar disorder and tobacco use who presents to establish care.  He is currently residing at [place] to get help with his addiction.  He is receiving treatment from [facility] for his [place] issues.  Pt complains of dental pain today.    Pt was seen at this office in the past.  He was seen twice in early 2020 but then never returned.  He was referred to GI for hepatitis treatment but only went to one appointment there.    Pt hasn't gotten covid vaccination and says he isn't going to.   Pt says his tooth he doesn't know if it's the same one that he says he got seen for in ER last month.  He says they just all hurt and are breaking.       Relevant past medical, surgical, family and social history reviewed and updated as indicated. Interim medical history since our last visit reviewed. Allergies and medications reviewed and updated.   Current Outpatient Medications:  .  Citalopram Hydrobromide (CELEXA PO), Take 1 tablet by mouth daily., Disp: , Rfl:  .  ibuprofen (ADVIL) 200 MG tablet, Take 200 mg by mouth every 6 (six) hours as needed., Disp: , Rfl:  .  traZODone (DESYREL) 100 MG tablet, Take 100 mg by mouth at bedtime., Disp: , Rfl:    Review of Systems  Per HPI unless specifically indicated above     Objective:    BP 106/78   Pulse 68   Temp 98.2 F (36.8 C)   Ht 5' 9.75" (1.772 m)   Wt 145 lb 8 oz (66 kg)   SpO2 97%   BMI 21.03 kg/m   Wt Readings from Last 3  Encounters:  12/18/19 145 lb 8 oz (66 kg)  07/09/19 160 lb (72.6 kg)  12/31/18 176 lb 3.2 oz (79.9 kg)    Physical Exam Vitals reviewed.  Constitutional:      General: He is not in acute distress.    Appearance: He is well-developed. He is not toxic-appearing.  HENT:     Head: Normocephalic and atraumatic.     Mouth/Throat:     Mouth: Mucous membranes are moist.     Dentition: Abnormal dentition. Dental caries present. No dental abscesses.     Comments: Extensive dental decay.  Front upper teeth decayed - some to about half their original size.  Many teeth missing. No swelling of the face. No drooling.  No trismus.  Eyes:     Extraocular Movements: Extraocular movements intact.     Conjunctiva/sclera: Conjunctivae normal.     Pupils: Pupils are equal, round, and reactive to light.  Cardiovascular:     Rate and Rhythm: Normal rate and regular rhythm.  Pulmonary:     Effort: Pulmonary effort is normal.     Breath sounds: Normal  breath sounds. No wheezing.  Abdominal:     General: Abdomen is flat. Bowel sounds are normal.     Palpations: Abdomen is soft. There is no hepatomegaly or splenomegaly.     Tenderness: There is no abdominal tenderness. There is no guarding or rebound.  Musculoskeletal:     Cervical back: Neck supple.     Right lower leg: No edema.     Left lower leg: No edema.  Lymphadenopathy:     Cervical: No cervical adenopathy.  Skin:    General: Skin is warm and dry.  Neurological:     Mental Status: He is alert and oriented to person, place, and time.     Cranial Nerves: No facial asymmetry.     Motor: No weakness or tremor.     Gait: Gait is intact. Gait normal.  Psychiatric:        Speech: Speech normal.        Behavior: Behavior normal.          Assessment & Plan:    Encounter Diagnoses  Name Primary?  . Encounter to establish care Yes  . Substance abuse in remission (HCC)   . Elevated LFTs   . Mental health problem   . Dentalgia   . Dental  decay   . Tobacco use disorder   . Hepatitis C virus infection without hepatic coma, unspecified chronicity   . Screening cholesterol level   . Elevated glucose   . Screening for diabetes mellitus   . COVID-19 virus vaccination declined       -will update labs -Pt says he would like to get back to GI for his hepatitis -he is given cafa/application for [facility] financial assistance -pt is put on Dental list.  rx amoxil for teeth.  Discussed with pt that he can't be on antibiotics constantly until he gets seen by the dentist.  He states understanding. -pt will follow up 1 month to review labs.  He is to contact office sooner prn

## 2019-12-23 ENCOUNTER — Encounter: Payer: Self-pay | Admitting: Gastroenterology

## 2020-01-14 ENCOUNTER — Encounter: Payer: Self-pay | Admitting: Physician Assistant

## 2020-01-14 ENCOUNTER — Other Ambulatory Visit: Payer: Self-pay

## 2020-01-14 ENCOUNTER — Ambulatory Visit: Payer: Self-pay | Admitting: Physician Assistant

## 2020-01-14 VITALS — BP 100/70 | HR 67 | Temp 99.1°F | Ht 69.75 in | Wt 150.9 lb

## 2020-01-14 DIAGNOSIS — Z9119 Patient's noncompliance with other medical treatment and regimen: Secondary | ICD-10-CM

## 2020-01-14 DIAGNOSIS — F1911 Other psychoactive substance abuse, in remission: Secondary | ICD-10-CM

## 2020-01-14 DIAGNOSIS — F17200 Nicotine dependence, unspecified, uncomplicated: Secondary | ICD-10-CM

## 2020-01-14 DIAGNOSIS — Z91199 Patient's noncompliance with other medical treatment and regimen due to unspecified reason: Secondary | ICD-10-CM

## 2020-01-14 DIAGNOSIS — F489 Nonpsychotic mental disorder, unspecified: Secondary | ICD-10-CM

## 2020-01-14 NOTE — Progress Notes (Signed)
   BP 100/70   Pulse 67   Temp 99.1 F (37.3 C)   Ht 5' 9.75" (1.772 m)   Wt 150 lb 14.4 oz (68.4 kg)   SpO2 98%   BMI 21.81 kg/m    Subjective:    Patient ID: Donald Sanders, male    DOB: [birth year], 29 y.o.   MRN: [id number]  HPI: Donald Sanders is a 29 y.o. male presenting on 01/14/2020 for Follow-up   HPI  Pt had a negative covid 19 screening questionnaire.     Pt is 29yoM with history of addiction and is living at [facility] house.  He has hepatitis but has never received treatment.    hE says he is doing well today and has no complaints    Relevant past medical, surgical, family and social history reviewed and updated as indicated. Interim medical history since our last visit reviewed. Allergies and medications reviewed and updated.   Current Outpatient Medications:  .  Citalopram Hydrobromide (CELEXA PO), Take 1 tablet by mouth daily., Disp: , Rfl:  .  ibuprofen (ADVIL) 200 MG tablet, Take 200 mg by mouth every 6 (six) hours as needed., Disp: , Rfl:  .  traZODone (DESYREL) 100 MG tablet, Take 100 mg by mouth at bedtime., Disp: , Rfl:      Review of Systems  Per HPI unless specifically indicated above     Objective:    BP 100/70   Pulse 67   Temp 99.1 F (37.3 C)   Ht 5' 9.75" (1.772 m)   Wt 150 lb 14.4 oz (68.4 kg)   SpO2 98%   BMI 21.81 kg/m   Wt Readings from Last 3 Encounters:  01/14/20 150 lb 14.4 oz (68.4 kg)  12/18/19 145 lb 8 oz (66 kg)  07/09/19 160 lb (72.6 kg)    Physical Exam Vitals reviewed.  Constitutional:      Appearance: He is well-developed.  HENT:     Head: Normocephalic and atraumatic.  Cardiovascular:     Rate and Rhythm: Normal rate and regular rhythm.  Pulmonary:     Effort: Pulmonary effort is normal.     Breath sounds: Normal breath sounds. No wheezing.  Abdominal:     General: Bowel sounds are normal.     Palpations: Abdomen is soft.     Tenderness: There is no abdominal tenderness.  Musculoskeletal:      Cervical back: Neck supple.  Lymphadenopathy:     Cervical: No cervical adenopathy.  Skin:    General: Skin is warm and dry.  Neurological:     Mental Status: He is alert and oriented to person, place, and time.  Psychiatric:        Behavior: Behavior normal.         Assessment & Plan:    Encounter Diagnoses  Name Primary?  . Noncompliance Yes  . Tobacco use disorder   . Substance abuse in remission (HCC)   . Mental health problem      -Pt to get labs that were ordered at new pt appointment on 6/24 but still not done -Encouraged covid vaccination -Schedule to return 1 month to review labs.  Pt to contact office sooner prn

## 2020-02-16 ENCOUNTER — Ambulatory Visit: Payer: Self-pay | Admitting: Gastroenterology

## 2020-02-16 ENCOUNTER — Encounter: Payer: Self-pay | Admitting: Gastroenterology

## 2020-02-16 NOTE — Progress Notes (Deleted)
° ° ° ° °  Primary Care Physician: Jacquelin Hawking, PA-C  Primary Gastroenterologist:  Hennie Duos. Marletta Lor, DO   No chief complaint on file.   HPI: Donald Sanders is a 29 y.o. male with history of polysubstance abuse, positive hepatitis C antibody, transaminitis.  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  He reports a second hepatitis C test was negative but we do not have records of what was checked.  Patient was initially seen in our practice in July 2020.  Labs in July 2020 showed positive HCVRNA of 255,000.  Genotype 1a.  In February 2020 his AST was 281, ALT 430.  July 2020 AST of 93, ALT 102.  HCV FibroSure showed F2, A3.  Abdominal ultrasound unremarkable, F2 with some F3 at risk for fibrosis.  We offered to start HCV treatment at that time as he was in remission with his polysubstance abuse.  Unfortunately he never followed back up.  He checked in in the ED on May 04, 2019 for relapse on heroin.  Current Outpatient Medications  Medication Sig Dispense Refill   Citalopram Hydrobromide (CELEXA PO) Take 1 tablet by mouth daily.     ibuprofen (ADVIL) 200 MG tablet Take 200 mg by mouth every 6 (six) hours as needed.     traZODone (DESYREL) 100 MG tablet Take 100 mg by mouth at bedtime.     No current facility-administered medications for this visit.    Allergies as of 02/16/2020 - Review Complete 01/14/2020  Allergen Reaction Noted   Tuberculin tests Itching 08/19/2018    ROS:  General: Negative for anorexia, weight loss, fever, chills, fatigue, weakness. ENT: Negative for hoarseness, difficulty swallowing , nasal congestion. CV: Negative for chest pain, angina, palpitations, dyspnea on exertion, peripheral edema.  Respiratory: Negative for dyspnea at rest, dyspnea on exertion, cough, sputum, wheezing.  GI: See history of present illness. GU:  Negative for dysuria, hematuria, urinary incontinence, urinary frequency, nocturnal urination.  Endo: Negative for  unusual weight change.    Physical Examination:   There were no vitals taken for this visit.  General: Well-nourished, well-developed in no acute distress.  Eyes: No icterus. Mouth: Oropharyngeal mucosa moist and pink , no lesions erythema or exudate. Lungs: Clear to auscultation bilaterally.  Heart: Regular rate and rhythm, no murmurs rubs or gallops.  Abdomen: Bowel sounds are normal, nontender, nondistended, no hepatosplenomegaly or masses, no abdominal bruits or hernia , no rebound or guarding.   Extremities: No lower extremity edema. No clubbing or deformities. Neuro: Alert and oriented x 4   Skin: Warm and dry, no jaundice.   Psych: Alert and cooperative, normal mood and affect.  Labs:  ***  Imaging Studies: No results found.

## 2020-02-17 ENCOUNTER — Other Ambulatory Visit (HOSPITAL_COMMUNITY)
Admission: RE | Admit: 2020-02-17 | Discharge: 2020-02-17 | Disposition: A | Discharge status: Home / Self Care | Payer: Self-pay | Source: Ambulatory Visit | Attending: Physician Assistant | Admitting: Physician Assistant

## 2020-02-17 DIAGNOSIS — R7309 Other abnormal glucose: Secondary | ICD-10-CM | POA: Insufficient documentation

## 2020-02-17 DIAGNOSIS — R7989 Other specified abnormal findings of blood chemistry: Secondary | ICD-10-CM | POA: Insufficient documentation

## 2020-02-17 DIAGNOSIS — Z131 Encounter for screening for diabetes mellitus: Secondary | ICD-10-CM | POA: Insufficient documentation

## 2020-02-17 DIAGNOSIS — B1920 Unspecified viral hepatitis C without hepatic coma: Secondary | ICD-10-CM | POA: Insufficient documentation

## 2020-02-17 DIAGNOSIS — Z13220 Encounter for screening for lipoid disorders: Secondary | ICD-10-CM | POA: Insufficient documentation

## 2020-02-17 LAB — COMPREHENSIVE METABOLIC PANEL
ALT: 54 U/L — ABNORMAL HIGH (ref 0–44)
AST: 40 U/L (ref 15–41)
Albumin: 5 g/dL (ref 3.5–5.0)
Alkaline Phosphatase: 78 U/L (ref 38–126)
Anion gap: 10 (ref 5–15)
BUN: 15 mg/dL (ref 6–20)
CO2: 23 mmol/L (ref 22–32)
Calcium: 9.5 mg/dL (ref 8.9–10.3)
Chloride: 103 mmol/L (ref 98–111)
Creatinine, Ser: 0.93 mg/dL (ref 0.61–1.24)
GFR calc Af Amer: >60 mL/min (ref >60)
GFR calc non Af Amer: >60 mL/min (ref >60)
Glucose, Bld: 104 mg/dL — ABNORMAL HIGH (ref 70–99)
Potassium: 3.9 mmol/L (ref 3.5–5.1)
Sodium: 136 mmol/L (ref 135–145)
Total Bilirubin: 0.9 mg/dL (ref 0.3–1.2)
Total Protein: 8.3 g/dL — ABNORMAL HIGH (ref 6.5–8.1)

## 2020-02-17 LAB — LIPID PANEL
Cholesterol: 151 mg/dL (ref 0–200)
HDL: 45 mg/dL (ref >40)
LDL Cholesterol: 93 mg/dL (ref 0–99)
Total CHOL/HDL Ratio: 3.4 RATIO
Triglycerides: 63 mg/dL (ref <150)
VLDL: 13 mg/dL (ref 0–40)

## 2020-02-17 LAB — HEMOGLOBIN A1C
Hgb A1c MFr Bld: 5.1 % (ref 4.8–5.6)
Mean Plasma Glucose: 100 mg/dL

## 2020-02-18 ENCOUNTER — Ambulatory Visit: Payer: Self-pay | Admitting: Physician Assistant

## 2020-02-23 ENCOUNTER — Ambulatory Visit: Payer: Self-pay | Admitting: Physician Assistant

## 2020-02-23 DIAGNOSIS — F489 Nonpsychotic mental disorder, unspecified: Secondary | ICD-10-CM

## 2020-02-23 DIAGNOSIS — K0889 Other specified disorders of teeth and supporting structures: Secondary | ICD-10-CM

## 2020-02-23 DIAGNOSIS — B1920 Unspecified viral hepatitis C without hepatic coma: Secondary | ICD-10-CM

## 2020-02-23 DIAGNOSIS — F17200 Nicotine dependence, unspecified, uncomplicated: Secondary | ICD-10-CM

## 2020-02-23 DIAGNOSIS — F1911 Other psychoactive substance abuse, in remission: Secondary | ICD-10-CM

## 2020-02-23 DIAGNOSIS — R7989 Other specified abnormal findings of blood chemistry: Secondary | ICD-10-CM

## 2020-02-23 NOTE — Progress Notes (Signed)
There were no vitals taken for this visit.   Subjective:    Patient ID: Donald Sanders, male    DOB: [birth year], 29 y.o.   MRN: [id number]  HPI: Donald Sanders is a 29 y.o. male presenting on 02/23/2020 for No chief complaint on file.   HPI  This is a telemedicine appointment through [employer] due to coronavirus pandemic.  I connected with  Mariusz Jubb on 02/23/20 by a video enabled telemedicine application and verified that I am speaking with the correct person using two identifiers.   I discussed the limitations of evaluation and management by telemedicine. The patient expressed understanding and agreed to proceed.  Pt is at [facility] house.  Provider is at office.      Pt says he is doing okay.  He is still at [facility] house.  He is still going to [facility].    He says his Teeth are his only issue.  He is on the dental list.  Pt doesn't think his CAFA/application for [facility] financial assistance that he was given at his June appointment was ever turned in.    He is still Not vaccinated for covid     Relevant past medical, surgical, family and social history reviewed and updated as indicated. Interim medical history since our last visit reviewed. Allergies and medications reviewed and updated.   Current Outpatient Medications:  .  Citalopram Hydrobromide (CELEXA PO), Take 1 tablet by mouth daily., Disp: , Rfl:  .  ibuprofen (ADVIL) 200 MG tablet, Take 200 mg by mouth every 6 (six) hours as needed., Disp: , Rfl:  .  traZODone (DESYREL) 100 MG tablet, Take 100 mg by mouth at bedtime., Disp: , Rfl:      Review of Systems  Per HPI unless specifically indicated above     Objective:    There were no vitals taken for this visit.  Wt Readings from Last 3 Encounters:  01/14/20 150 lb 14.4 oz (68.4 kg)  12/18/19 145 lb 8 oz (66 kg)  07/09/19 160 lb (72.6 kg)    Physical Exam Constitutional:      General: He is not in acute distress. HENT:     Head: Normocephalic and  atraumatic.  Pulmonary:     Effort: Pulmonary effort is normal. No respiratory distress.  Neurological:     Mental Status: He is alert and oriented to person, place, and time.  Psychiatric:        Attention and Perception: Attention normal.        Speech: Speech normal.        Behavior: Behavior normal.     Results for orders placed or performed during the hospital encounter of 02/17/20  Lipid panel  Result Value Ref Range   Cholesterol 151 0 - 200 mg/dL   Triglycerides 63 <528 mg/dL   HDL 45 >41 mg/dL   Total CHOL/HDL Ratio 3.4 RATIO   VLDL 13 0 - 40 mg/dL   LDL Cholesterol 93 0 - 99 mg/dL  Comprehensive metabolic panel  Result Value Ref Range   Sodium 136 135 - 145 mmol/L   Potassium 3.9 3.5 - 5.1 mmol/L   Chloride 103 98 - 111 mmol/L   CO2 23 22 - 32 mmol/L   Glucose, Bld 104 (H) 70 - 99 mg/dL   BUN 15 6 - 20 mg/dL   Creatinine, Ser 3.24 0.61 - 1.24 mg/dL   Calcium 9.5 8.9 - 40.1 mg/dL   Total Protein 8.3 (H) 6.5 - 8.1 g/dL  Albumin 5.0 3.5 - 5.0 g/dL   AST 40 15 - 41 U/L   ALT 54 (H) 0 - 44 U/L   Alkaline Phosphatase 78 38 - 126 U/L   Total Bilirubin 0.9 0.3 - 1.2 mg/dL   GFR calc non Af Amer >60 >60 mL/min   GFR calc Af Amer >60 >60 mL/min   Anion gap 10 5 - 15  Hemoglobin A1c  Result Value Ref Range   Hgb A1c MFr Bld 5.1 4.8 - 5.6 %   Mean Plasma Glucose 100 mg/dL      Assessment & Plan:    Encounter Diagnoses  Name Primary?  . Substance abuse in remission (HCC) Yes  . Mental health problem   . Elevated LFTs   . Dentalgia   . Tobacco use disorder   . Hepatitis C virus infection without hepatic coma, unspecified chronicity     -reviewed labs with pt  -pt is mailed another application for [facility] financial assistance. -he is to call GI to reschedule with them for his hepatitis -pt is on dental list -pt to follow up here 1 year.  He is to contact office sooner prn

## 2020-07-31 IMAGING — DX DG CHEST 1V PORT
1 series · 1 of 1 positions shown · non-contrast
Comparison: None.

CLINICAL DATA: 28-year-old male with 2 days of chest pain,
pleuritic pain. Smoker.

EXAM:
PORTABLE CHEST 1 VIEW

[chest ap]
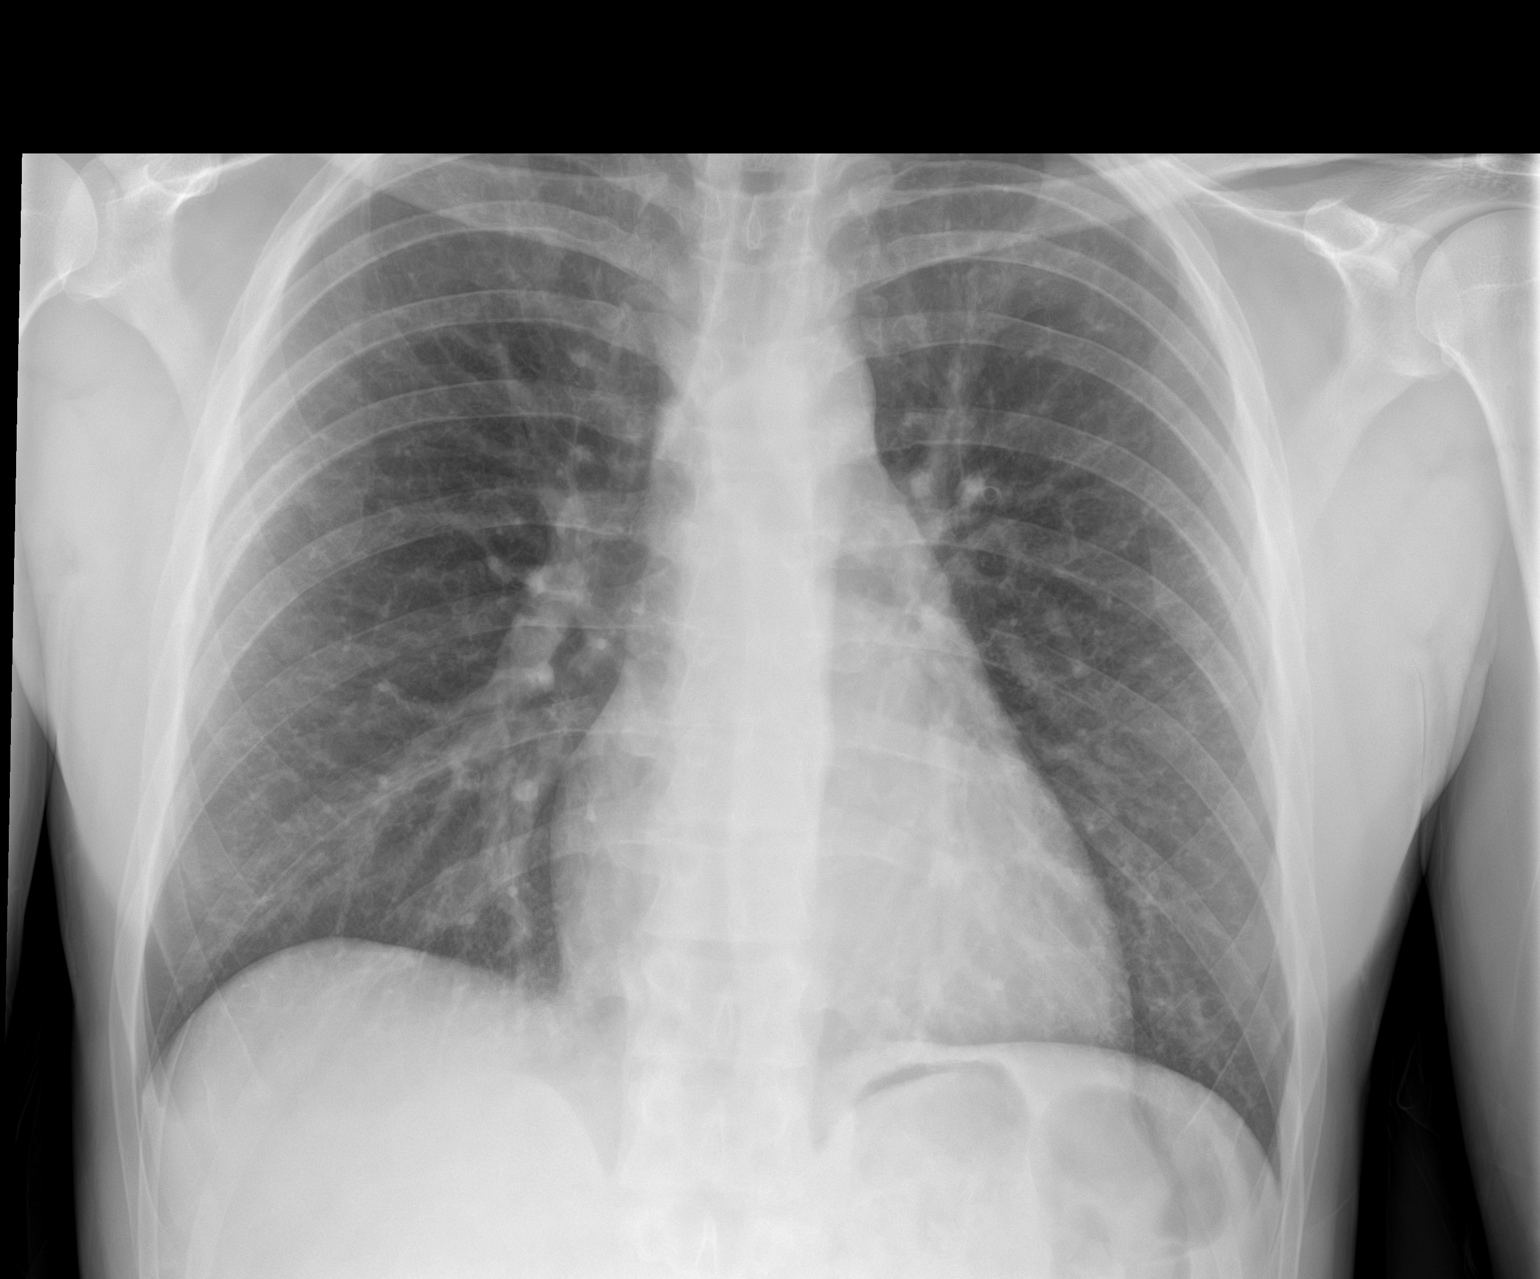

[1 of 1 positions shown; findings below may reference images not displayed]

FINDINGS: Portable AP upright view at 8131 hours. Lung volumes and mediastinal
contours within normal limits. Visualized tracheal air column is
within normal limits. Borderline to mild diffuse increased pulmonary
interstitial markings. Otherwise when allowing for portable
technique both lungs appear clear. No pneumothorax or pleural
effusion.

Negative visible bowel gas pattern and osseous structures.
IMPRESSION: Negative portable chest; borderline to mildly increased pulmonary
interstitial markings are probably smoking related.

## 2021-02-21 ENCOUNTER — Ambulatory Visit: Payer: Self-pay | Admitting: Physician Assistant
